# Patient Record
Sex: Female | Born: 1970 | Race: White | Hispanic: No | Smoking: Former smoker
Health system: Southern US, Community
[De-identification: ages and names within clinical notes are randomized; demographics above are authoritative.]

---

## 2004-01-20 ENCOUNTER — Emergency Department (HOSPITAL_COMMUNITY): Admission: EM | Admit: 2004-01-20 | Discharge: 2004-01-20 | Payer: Self-pay | Admitting: *Deleted

## 2008-11-15 ENCOUNTER — Emergency Department (HOSPITAL_COMMUNITY): Admission: EM | Admit: 2008-11-15 | Discharge: 2008-11-15 | Payer: Self-pay | Admitting: Emergency Medicine

## 2010-04-08 ENCOUNTER — Emergency Department (HOSPITAL_COMMUNITY): Admission: EM | Admit: 2010-04-08 | Discharge: 2010-04-08 | Payer: Self-pay | Admitting: Emergency Medicine

## 2012-04-20 ENCOUNTER — Ambulatory Visit
Admission: RE | Admit: 2012-04-20 | Discharge: 2012-04-20 | Disposition: A | Discharge status: Home / Self Care | Payer: Medicaid Other | Source: Ambulatory Visit | Attending: Physician Assistant | Admitting: Physician Assistant

## 2012-04-20 ENCOUNTER — Other Ambulatory Visit: Payer: Self-pay | Admitting: Physician Assistant

## 2012-04-20 DIAGNOSIS — M542 Cervicalgia: Secondary | ICD-10-CM

## 2012-04-20 DIAGNOSIS — M545 Low back pain: Secondary | ICD-10-CM

## 2016-12-11 ENCOUNTER — Emergency Department
Admission: EM | Admit: 2016-12-11 | Discharge: 2016-12-11 | Disposition: A | Discharge status: Home / Self Care | Payer: Medicaid Other | Attending: Emergency Medicine | Admitting: Emergency Medicine

## 2016-12-11 ENCOUNTER — Encounter: Payer: Self-pay | Admitting: Emergency Medicine

## 2016-12-11 ENCOUNTER — Emergency Department: Payer: Medicaid Other

## 2016-12-11 DIAGNOSIS — N3000 Acute cystitis without hematuria: Secondary | ICD-10-CM

## 2016-12-11 DIAGNOSIS — Z87891 Personal history of nicotine dependence: Secondary | ICD-10-CM | POA: Insufficient documentation

## 2016-12-11 DIAGNOSIS — R1012 Left upper quadrant pain: Secondary | ICD-10-CM | POA: Diagnosis present

## 2016-12-11 DIAGNOSIS — R109 Unspecified abdominal pain: Secondary | ICD-10-CM

## 2016-12-11 LAB — COMPREHENSIVE METABOLIC PANEL
ALK PHOS: 59 U/L (ref 38–126)
ALT: 18 U/L (ref 14–54)
ANION GAP: 4 — AB (ref 5–15)
AST: 27 U/L (ref 15–41)
Albumin: 4 g/dL (ref 3.5–5.0)
BILIRUBIN TOTAL: 0.2 mg/dL — AB (ref 0.3–1.2)
BUN: 8 mg/dL (ref 6–20)
CALCIUM: 9.1 mg/dL (ref 8.9–10.3)
CO2: 25 mmol/L (ref 22–32)
CREATININE: 0.79 mg/dL (ref 0.44–1.00)
Chloride: 110 mmol/L (ref 101–111)
GFR calc non Af Amer: >60 mL/min (ref >60)
Glucose, Bld: 97 mg/dL (ref 65–99)
Potassium: 4.4 mmol/L (ref 3.5–5.1)
Sodium: 139 mmol/L (ref 135–145)
TOTAL PROTEIN: 7.1 g/dL (ref 6.5–8.1)

## 2016-12-11 LAB — URINALYSIS, COMPLETE (UACMP) WITH MICROSCOPIC
Bilirubin Urine: NEGATIVE
Glucose, UA: NEGATIVE mg/dL
HGB URINE DIPSTICK: NEGATIVE
Ketones, ur: NEGATIVE mg/dL
LEUKOCYTES UA: NEGATIVE
NITRITE: POSITIVE — AB
PROTEIN: NEGATIVE mg/dL
SPECIFIC GRAVITY, URINE: 1.015 (ref 1.005–1.030)
pH: 7 (ref 5.0–8.0)

## 2016-12-11 LAB — CBC
HCT: 35.6 % (ref 35.0–47.0)
HEMOGLOBIN: 12 g/dL (ref 12.0–16.0)
MCH: 29.5 pg (ref 26.0–34.0)
MCHC: 33.7 g/dL (ref 32.0–36.0)
MCV: 87.5 fL (ref 80.0–100.0)
PLATELETS: 351 10*3/uL (ref 150–440)
RBC: 4.06 MIL/uL (ref 3.80–5.20)
RDW: 15.1 % — ABNORMAL HIGH (ref 11.5–14.5)
WBC: 7.6 10*3/uL (ref 3.6–11.0)

## 2016-12-11 LAB — PREGNANCY, URINE: PREG TEST UR: NEGATIVE

## 2016-12-11 LAB — LIPASE, BLOOD: Lipase: 23 U/L (ref 11–51)

## 2016-12-11 MED ORDER — IOPAMIDOL (ISOVUE-300) INJECTION 61%
100.0000 mL | Freq: Once | INTRAVENOUS | Status: AC | PRN
  Administered 2016-12-11: 100 mL via INTRAVENOUS
  Filled 2016-12-11: qty 100

## 2016-12-11 MED ORDER — NITROFURANTOIN MONOHYD MACRO 100 MG PO CAPS
100.0000 mg | ORAL_CAPSULE | Freq: Once | ORAL | Status: AC
  Administered 2016-12-11: 100 mg via ORAL
  Filled 2016-12-11 (×2): qty 1

## 2016-12-11 MED ORDER — KETOROLAC TROMETHAMINE 30 MG/ML IJ SOLN
10.0000 mg | Freq: Once | INTRAMUSCULAR | Status: AC
  Administered 2016-12-11: 9.9 mg via INTRAVENOUS
  Filled 2016-12-11: qty 1

## 2016-12-11 MED ORDER — NITROFURANTOIN MONOHYD MACRO 100 MG PO CAPS: 100.0000 mg | ORAL_CAPSULE | Freq: Two times a day (BID) | ORAL | 0 refills | Status: AC

## 2016-12-11 MED ORDER — IOPAMIDOL (ISOVUE-300) INJECTION 61%
30.0000 mL | Freq: Once | INTRAVENOUS | Status: AC | PRN
  Administered 2016-12-11: 30 mL via ORAL
  Filled 2016-12-11: qty 30

## 2016-12-11 NOTE — ED Triage Notes (Signed)
Says abd pain since this am.  Says ate something and it got worse.

## 2016-12-11 NOTE — ED Provider Notes (Signed)
[facility] Emergency Department Provider Note  ____________________________________________  Time seen: Approximately 2:52 PM  I have reviewed the triage vital signs and the nursing notes.   HISTORY  Chief Complaint Abdominal Pain   HPI Brenda Buchanan is a 46 y.o. female no significant past medical history presents for evaluation of abdominal pain. Patient reports that the pain started 9 AM this morning after she had a peanut butter and jelly sandwich. The pain is sharp, colicky, intermittent, currently 7 out of 10, located in the upper quadrants of her abdomen. She denies nausea or vomiting, chest pain or shortness of breath, fever or chills, diarrhea or constipation, vaginal discharge, hematuria. She denies ever having similar pain. She hasn't tried anything at home for the pain.Patient reports mild vaginal discomfort when she urinates.  History reviewed. No pertinent past medical history.  There are no active problems to display for this patient.   History reviewed. No pertinent surgical history.  Prior to Admission medications   Medication Sig Start Date End Date Taking? Authorizing Provider  nitrofurantoin, macrocrystal-monohydrate, (MACROBID) 100 MG capsule Take 1 capsule (100 mg total) by mouth 2 (two) times daily. 12/11/16 12/16/16  Nita Sickle, MD    Allergies Patient has no known allergies.  No family history on file.  Social History Social History  Substance Use Topics  . Smoking status: Former Games developer  . Smokeless tobacco: Never Used  . Alcohol use No    Review of Systems  Constitutional: Negative for fever. Eyes: Negative for visual changes. ENT: Negative for sore throat. Neck: No neck pain  Cardiovascular: Negative for chest pain. Respiratory: Negative for shortness of breath. Gastrointestinal: + upper abdominal pain. No vomiting or diarrhea. Genitourinary: Negative for dysuria. Musculoskeletal: Negative for back  pain. Skin: Negative for rash. Neurological: Negative for headaches, weakness or numbness. Psych: No SI or HI  ____________________________________________   PHYSICAL EXAM:  VITAL SIGNS: ED Triage Vitals  Enc Vitals Group     BP 12/11/16 1313 116/73     Pulse Rate 12/11/16 1313 71     Resp 12/11/16 1313 16     Temp 12/11/16 1313 98.3 F (36.8 C)     Temp Source 12/11/16 1313 Oral     SpO2 12/11/16 1313 99 %     Weight --      Height 12/11/16 1320 5\' 4"  (1.626 m)     Head Circumference --      Peak Flow --      Pain Score 12/11/16 1320 7     Pain Loc --      Pain Edu? --      Excl. in GC? --     Constitutional: Alert and oriented. Well appearing and in no apparent distress. HEENT:      Head: Normocephalic and atraumatic.         Eyes: Conjunctivae are normal. Sclera is non-icteric. EOMI. PERRL      Mouth/Throat: Mucous membranes are moist.       Neck: Supple with no signs of meningismus. Cardiovascular: Regular rate and rhythm. No murmurs, gallops, or rubs. 2+ symmetrical distal pulses are present in all extremities. No JVD. Respiratory: Normal respiratory effort. Lungs are clear to auscultation bilaterally. No wheezes, crackles, or rhonchi.  Gastrointestinal: Soft, tenderness to palpation mostly on the left quadrants with mild tenderness epigastrically, no tenderness in the right upper and lower quadrants, and non distended with positive bowel sounds. No rebound or guarding. Genitourinary: No CVA tenderness. Musculoskeletal: Nontender with  normal range of motion in all extremities. No edema, cyanosis, or erythema of extremities. Neurologic: Normal speech and language. Face is symmetric. Moving all extremities. No gross focal neurologic deficits are appreciated. Skin: Skin is warm, dry and intact. No rash noted. Psychiatric: Mood and affect are normal. Speech and behavior are normal.  ____________________________________________   LABS (all labs ordered are listed, but  only abnormal results are displayed)  Labs Reviewed  COMPREHENSIVE METABOLIC PANEL - Abnormal; Notable for the following:       Result Value   Total Bilirubin 0.2 (*)    Anion gap 4 (*)    All other components within normal limits  CBC - Abnormal; Notable for the following:    RDW 15.1 (*)    All other components within normal limits  URINALYSIS, COMPLETE (UACMP) WITH MICROSCOPIC - Abnormal; Notable for the following:    Color, Urine YELLOW (*)    APPearance CLEAR (*)    Nitrite POSITIVE (*)    Bacteria, UA MANY (*)    Squamous Epithelial / LPF 0-5 (*)    All other components within normal limits  URINE CULTURE  LIPASE, BLOOD  PREGNANCY, URINE  POC URINE PREG, ED   ____________________________________________  EKG  ED ECG REPORT I, [name], the attending physician, personally viewed and interpreted this ECG.  Normal sinus rhythm, rate of 66, normal intervals, normal axis, no ST elevations or depressions. Normal EKG. ____________________________________________  RADIOLOGY  CT a/p: 1. No acute findings or explanation for the patient's symptoms. 2. Probable mild heterogeneous hepatic steatosis. 3. Uterine fibroids. 4. Small umbilical and supraumbilical hernias containing only fat. 5. Stable sclerotic lesions in the spine and pelvis from 2013 radiographs. ____________________________________________   PROCEDURES  Procedure(s) performed: None Procedures Critical Care performed:  None ____________________________________________   INITIAL IMPRESSION / ASSESSMENT AND PLAN / ED COURSE  46 y.o. female no significant past medical history presents for evaluation of abdominal pain. Patient is well-appearing, in no distress, has normal vital signs, she is tender to palpation on the left upper quadrant and epigastric region with no rebound or guarding. CBC with no leukocytosis, CMP and lipase normal. Patient has positive nitrites and many bacteria in her urine will  treat with macrobid. We'll send patient for CT of abdomen and pelvis to rule out perforated ulcer vs diverticulitis. Clinical Course as of Dec 12 2230  Wed Dec 11, 2016  1700 CT with no acute findings other than fibroids. We'll discharge home and close follow-up with PCP for recheck. Discussed return precautions with patient. Patient will be given Macrobid for UTI.  [CV]    Clinical Course User Index [CV] Nita Sicklearolina Dandra Velardi, MD    Pertinent labs & imaging results that were available during my care of the patient were reviewed by me and considered in my medical decision making (see chart for details).    ____________________________________________   FINAL CLINICAL IMPRESSION(S) / ED DIAGNOSES  Final diagnoses:  Acute cystitis without hematuria  Abdominal pain, unspecified abdominal location      NEW MEDICATIONS STARTED DURING THIS VISIT:  Discharge Medication List as of 12/11/2016  4:58 PM    START taking these medications   Details  nitrofurantoin, macrocrystal-monohydrate, (MACROBID) 100 MG capsule Take 1 capsule (100 mg total) by mouth 2 (two) times daily., Starting Wed 12/11/2016, Until Mon 12/16/2016, Print         Note:  This document was prepared using Dragon voice recognition software and may include unintentional dictation errors.    [place]  Don Perking, MD 12/11/16 2232

## 2016-12-11 NOTE — ED Notes (Signed)
Says she just lost her mother 1 week ago.

## 2016-12-11 NOTE — Discharge Instructions (Signed)

## 2016-12-13 LAB — URINE CULTURE: Culture: >=100000 — AB

## 2017-08-05 IMAGING — CT CT ABD-PELV W/ CM
2 of 5 series · 15 of 46 positions shown, 17 images · IV contrast (APPLIED)
Comparison: Lumbar spine radiographs 04/20/2012.

CLINICAL DATA: Left upper and lower quadrant abdominal pain today
after eating.

EXAM:
CT ABDOMEN AND PELVIS WITH CONTRAST
TECHNIQUE: Multidetector CT imaging of the abdomen and pelvis was performed
using the standard protocol following bolus administration of
intravenous contrast.
CONTRAST:  100mL 2HI12L-H88 IOPAMIDOL (2HI12L-H88) INJECTION 61%

[Series 2: routine abd/pel with · axial · 0.87mm/px · z∈[-474,-29]mm · 12 of 99 slices shown, 14 images]
[im 5/99  soft-tissue]
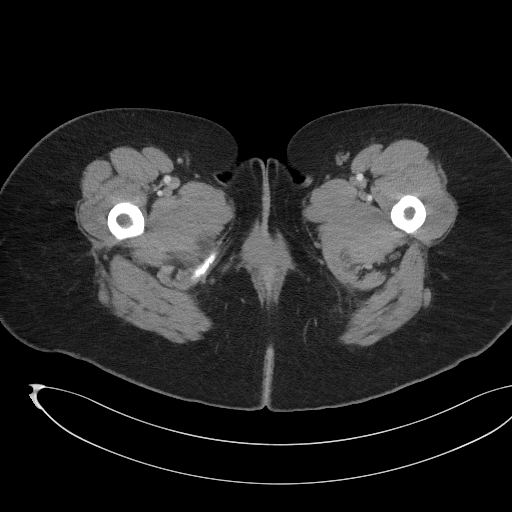
[im 5/99  bone]
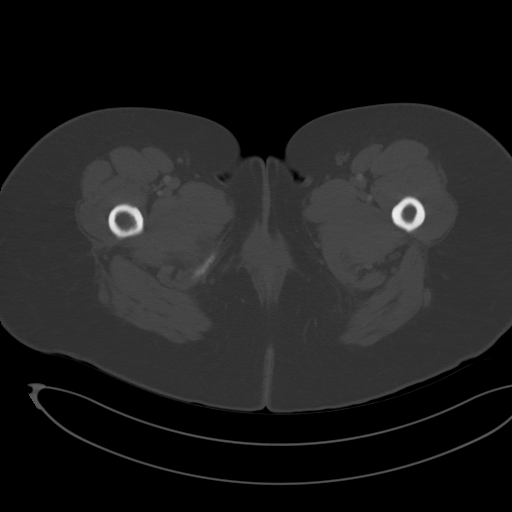
[im 15/99  soft-tissue]
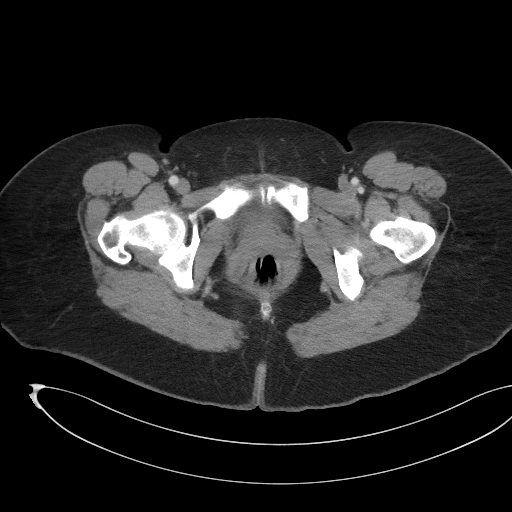
[im 24/99  soft-tissue]
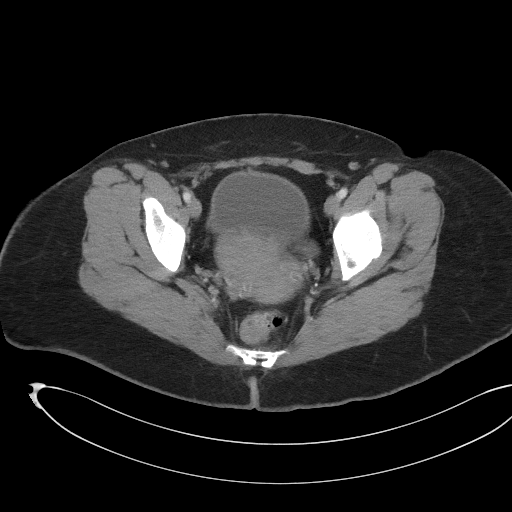
[im 29/99  soft-tissue]
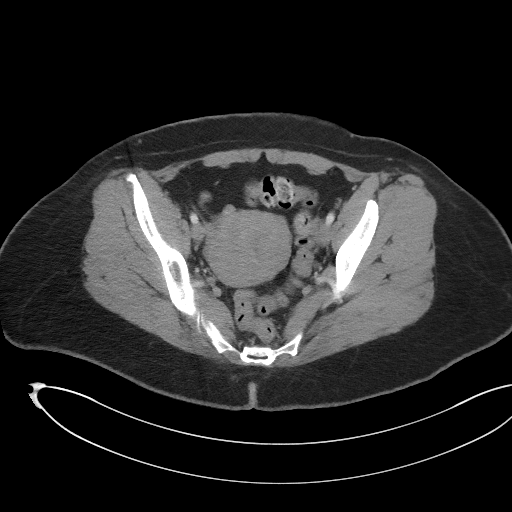
[im 38/99  soft-tissue]
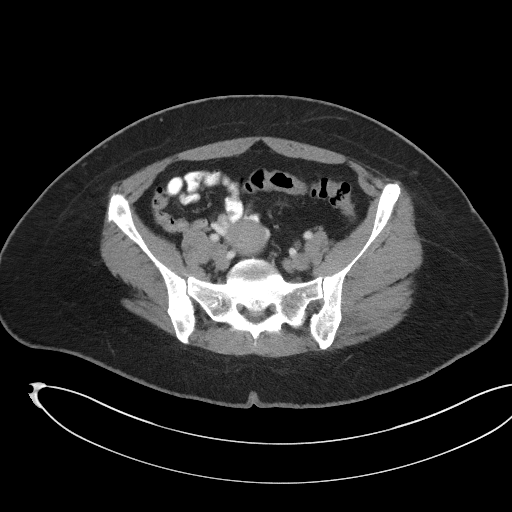
[im 47/99  soft-tissue]
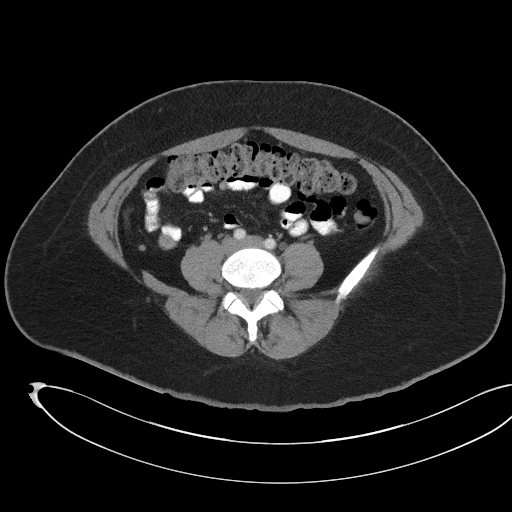
[im 52/99  soft-tissue]
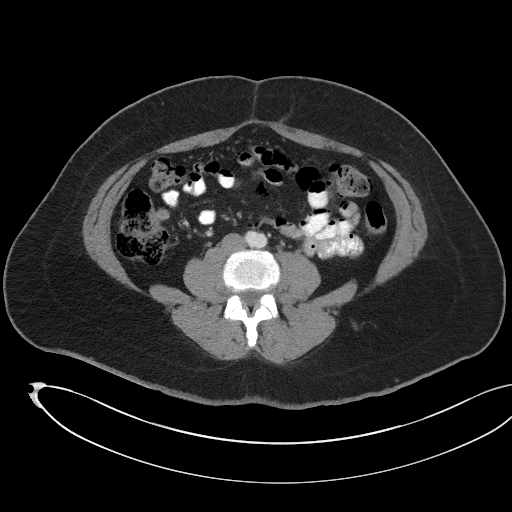
[im 61/99  soft-tissue]
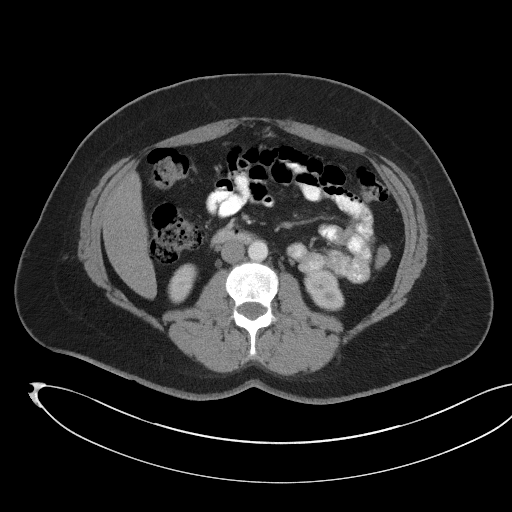
[im 71/99  soft-tissue]
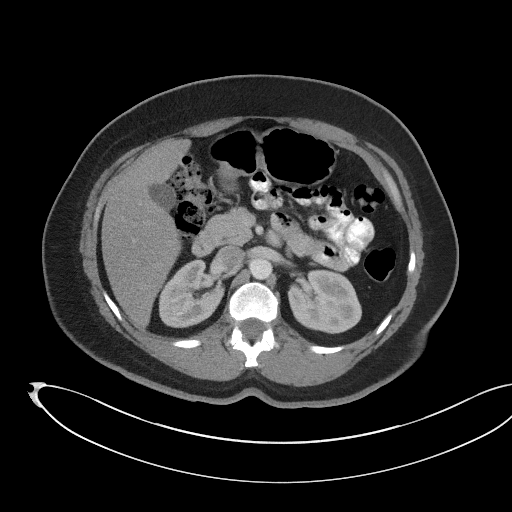
[im 71/99  bone]
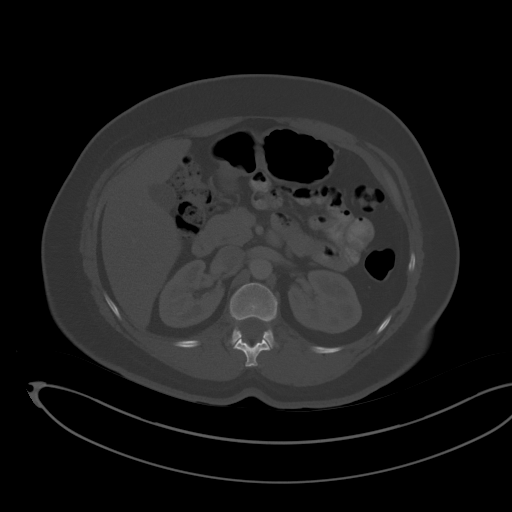
[im 75/99  soft-tissue]
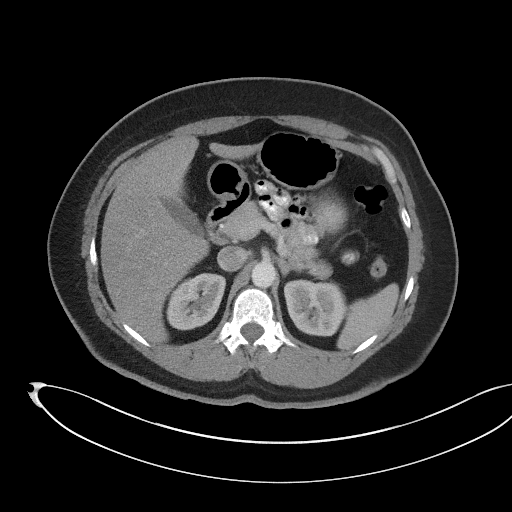
[im 85/99  soft-tissue]
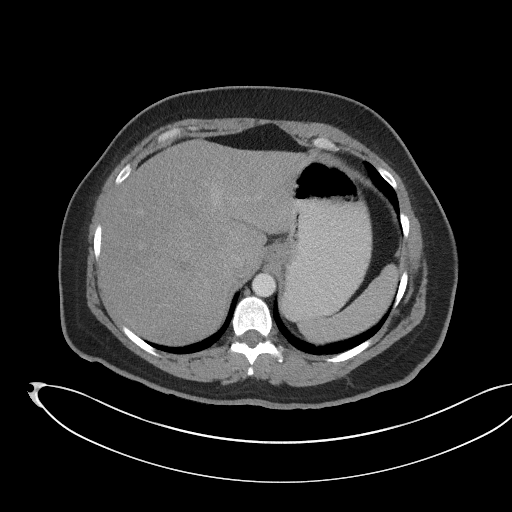
[im 94/99  soft-tissue]
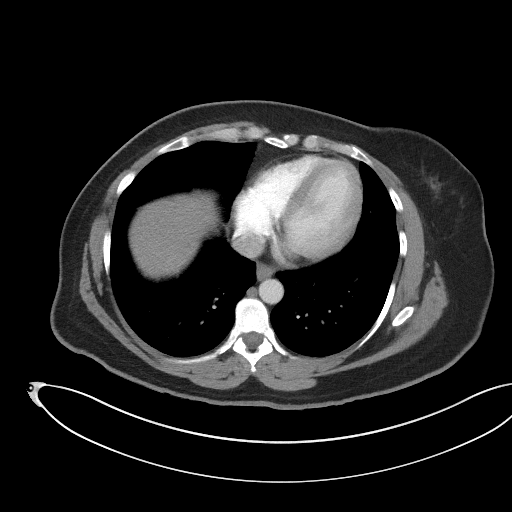

[Series 5: coronal st · coronal · 0.78mm/px · 3 of 93 slices shown]
[im 31/93  soft-tissue]
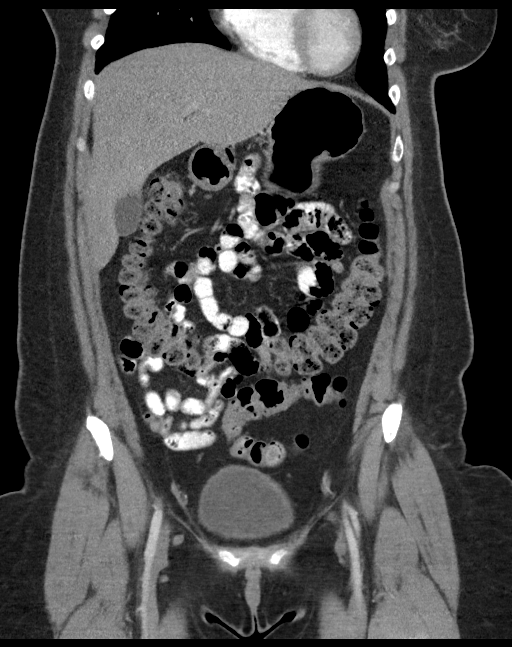
[im 41/93  soft-tissue]
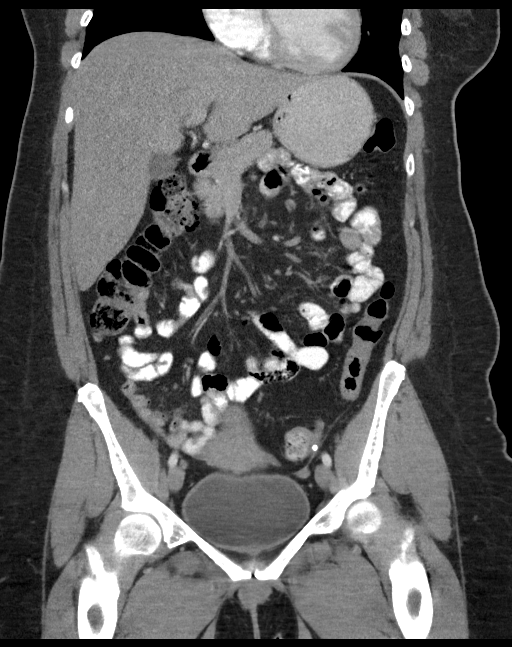
[im 52/93  soft-tissue]
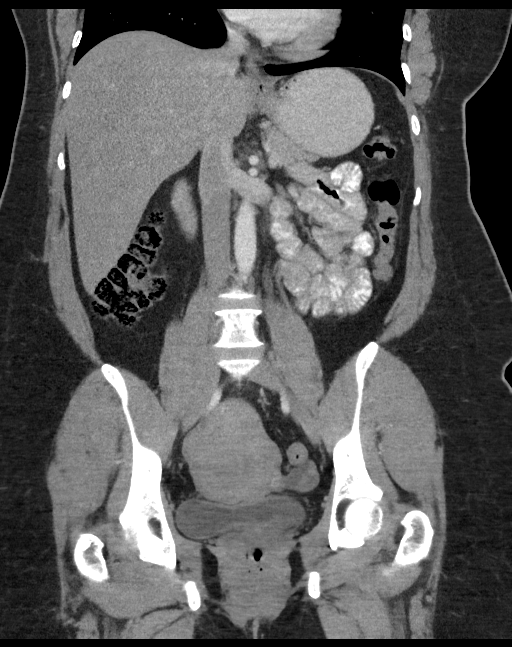

[15 of 46 positions shown; findings below may reference images not displayed]

FINDINGS: Lower chest: Clear lung bases. No significant pleural or pericardial
effusion.

Hepatobiliary: The liver is imaged prior to opacification of the
hepatic veins. The hepatic attenuation is mildly heterogeneous
without focally suspicious lesion, suggesting steatosis. There is a
probable 7 mm cyst in the left lobe on image 11. No evidence of
gallstones, gallbladder wall thickening or biliary dilatation.

Pancreas: Unremarkable. No pancreatic ductal dilatation or
surrounding inflammatory changes.

Spleen: Normal in size without focal abnormality.

Adrenals/Urinary Tract: Both adrenal glands appear normal. The
kidneys appear normal without evidence of urinary tract calculus,
suspicious lesion or hydronephrosis. No bladder abnormalities are
seen.

Stomach/Bowel: No evidence of bowel wall thickening, distention or
surrounding inflammatory change. The appendix appears normal.

Vascular/Lymphatic: There are no enlarged abdominal or pelvic lymph
nodes. No significant vascular findings are present.

Reproductive: The uterus is enlarged and lobulated, consistent with
multiple fibroids. There is a posterior fundal fibroid measuring up
to 4.8 cm on sagittal image number 67. No evidence of adnexal mass
or pelvic inflammation.

Other: Tiny umbilical hernia containing only fat. There are
additional small supraumbilical hernias containing only fat. No
herniated bowel or ascites.

Musculoskeletal: No acute osseous findings. Numerous sclerotic
lesions are again noted within the pelvis and lumbar spine, similar
to previous radiographs and attributed to bone islands based on
stability.
IMPRESSION: 1. No acute findings or explanation for the patient's symptoms.
2. Probable mild heterogeneous hepatic steatosis.
3. Uterine fibroids.
4. Small umbilical and supraumbilical hernias containing only fat.
5. Stable sclerotic lesions in the spine and pelvis from 4594
radiographs.

## 2019-03-09 ENCOUNTER — Ambulatory Visit: Payer: Self-pay | Admitting: Internal Medicine
